# Patient Record
Sex: Female | Born: 1980 | Race: White | Hispanic: No | Smoking: Current every day smoker
Health system: Southern US, Community
[De-identification: ages and names within clinical notes are randomized; demographics above are authoritative.]

## PROBLEM LIST (undated history)

## (undated) DIAGNOSIS — K219 Gastro-esophageal reflux disease without esophagitis: Secondary | ICD-10-CM

## (undated) DIAGNOSIS — R569 Unspecified convulsions: Secondary | ICD-10-CM

## (undated) DIAGNOSIS — T8859XA Other complications of anesthesia, initial encounter: Secondary | ICD-10-CM

## (undated) HISTORY — PX: BRAIN SURGERY: SHX531

## (undated) HISTORY — PX: DIAGNOSTIC LAPAROSCOPY: SUR761

---

## 2000-02-01 ENCOUNTER — Inpatient Hospital Stay (HOSPITAL_COMMUNITY): Admission: EM | Admit: 2000-02-01 | Discharge: 2000-02-02 | Payer: Self-pay | Admitting: *Deleted

## 2001-12-10 ENCOUNTER — Encounter: Payer: Self-pay | Admitting: Emergency Medicine

## 2001-12-10 ENCOUNTER — Emergency Department (HOSPITAL_COMMUNITY): Admission: EM | Admit: 2001-12-10 | Discharge: 2001-12-10 | Payer: Self-pay | Admitting: Emergency Medicine

## 2004-04-11 ENCOUNTER — Ambulatory Visit (HOSPITAL_COMMUNITY): Admission: RE | Admit: 2004-04-11 | Discharge: 2004-04-11 | Payer: Self-pay | Admitting: Emergency Medicine

## 2005-04-29 ENCOUNTER — Emergency Department (HOSPITAL_COMMUNITY): Admission: EM | Admit: 2005-04-29 | Discharge: 2005-04-29 | Payer: Self-pay | Admitting: Emergency Medicine

## 2005-06-18 ENCOUNTER — Emergency Department (HOSPITAL_COMMUNITY): Admission: EM | Admit: 2005-06-18 | Discharge: 2005-06-18 | Payer: Self-pay | Admitting: Emergency Medicine

## 2007-02-22 ENCOUNTER — Emergency Department (HOSPITAL_COMMUNITY): Admission: EM | Admit: 2007-02-22 | Discharge: 2007-02-22 | Payer: Self-pay | Admitting: Emergency Medicine

## 2010-10-21 ENCOUNTER — Other Ambulatory Visit (HOSPITAL_COMMUNITY): Payer: Self-pay

## 2010-10-23 ENCOUNTER — Ambulatory Visit (HOSPITAL_COMMUNITY)
Admission: RE | Admit: 2010-10-23 | Discharge: 2010-10-23 | Disposition: A | Discharge status: Home / Self Care | Payer: Medicaid Other | Source: Ambulatory Visit | Attending: Oral Surgery | Admitting: Oral Surgery

## 2010-10-23 ENCOUNTER — Ambulatory Visit (HOSPITAL_COMMUNITY): Payer: Medicaid Other

## 2010-10-23 DIAGNOSIS — Z85841 Personal history of malignant neoplasm of brain: Secondary | ICD-10-CM | POA: Insufficient documentation

## 2010-10-23 DIAGNOSIS — I499 Cardiac arrhythmia, unspecified: Secondary | ICD-10-CM | POA: Insufficient documentation

## 2010-10-23 DIAGNOSIS — K029 Dental caries, unspecified: Secondary | ICD-10-CM | POA: Insufficient documentation

## 2010-10-23 DIAGNOSIS — G40909 Epilepsy, unspecified, not intractable, without status epilepticus: Secondary | ICD-10-CM | POA: Insufficient documentation

## 2010-10-23 DIAGNOSIS — IMO0002 Reserved for concepts with insufficient information to code with codable children: Secondary | ICD-10-CM

## 2010-10-23 DIAGNOSIS — Z01818 Encounter for other preprocedural examination: Secondary | ICD-10-CM | POA: Insufficient documentation

## 2010-10-23 DIAGNOSIS — I10 Essential (primary) hypertension: Secondary | ICD-10-CM | POA: Insufficient documentation

## 2010-10-23 DIAGNOSIS — Z923 Personal history of irradiation: Secondary | ICD-10-CM | POA: Insufficient documentation

## 2010-10-23 LAB — BASIC METABOLIC PANEL
CO2: 24 mEq/L (ref 19–32)
GFR calc Af Amer: >60 mL/min (ref >60)
Glucose, Bld: 91 mg/dL (ref 70–99)
Potassium: 3.8 mEq/L (ref 3.5–5.1)
Sodium: 136 mEq/L (ref 135–145)

## 2010-10-23 LAB — CBC
HCT: 38.8 % (ref 36.0–46.0)
Hemoglobin: 13.2 g/dL (ref 12.0–15.0)
MCV: 91.3 fL (ref 78.0–100.0)
RBC: 4.25 MIL/uL (ref 3.87–5.11)
WBC: 6.2 10*3/uL (ref 4.0–10.5)

## 2010-10-26 NOTE — Op Note (Signed)
NAME:  Kathryn Benson, Kathryn Benson NO.:  [id number]  MEDICAL RECORD NO.:  [id number]           PATIENT TYPE:  O  LOCATION:  [facility]                         FACILITY:  [facility]  PHYSICIAN:  Georgia Lopes, M.D.  DATE OF BIRTH:  [birth year]  DATE OF PROCEDURE:  10/23/2010 DATE OF DISCHARGE:  10/23/2010                              OPERATIVE REPORT   PREOPERATIVE DIAGNOSES: 1. Nonrestorable teeth numbers 17, 18, 19, 20, 25, 26, 27, 28, 29, 31,     and 32. 2. Irregular maxillary alveolus, upper right.  POSTOPERATIVE DIAGNOSES: 1. Nonrestorable teeth numbers 17, 18, 19, 20, 25, 26, 27, 28, 29, 31,     and 32. 2. Irregular maxillary alveolus, upper right.  PROCEDURES: 1. Extraction of teeth numbers 17, 18, 19, 20, 25, 26, 27, 28, 29, 31,     and 32. 2. Alveoplasty, upper right, lower right, and lower left.  SURGEON:  Georgia Lopes, MD  ANESTHESIA:  General, oral.  ASSISTANT:  Luberta Mutter, DOMA  INDICATIONS FOR PROCEDURE:  Kathryn Benson is a 30 year old female who was referred by her general dentist having undergone prior removal of all upper teeth by another oral surgeon as an outpatient, but the surgeon was unable to completely anesthetize the patient in order to remove all of the lower teeth as planned.  Her past medical history is significant for hypertension, seizures, and arrhythmias.  She is status post brain CA in 1986 under which she underwent surgery, radiation therapy, and chemotherapy.  Because of the difficulty with prior anesthesia and extensiveness of the procedure planned, it was recommended that the patient be intubated for airway protection to have general anesthesia.  PROCEDURE:  The patient was taken to the operating room and placed on the table in supine position.  General anesthesia was administered intravenously.  Nasal tube was attempted, but endotracheal tube was placed after the nasal intubation was unsuccessful.  The eyes were protected.  Then, the  patient was draped and the posterior pharynx was suctioned with the Yankauer suction.  A throat pack was placed.  2% lidocaine with 1:100,000 epinephrine was infiltrated and an inferior alveolar block on the right and left side and buccal infiltration around the mandible and the maxilla infiltration was performed in the upper right, a total of 16 mL was utilized of solution.  Then, a #15 blade was used to make a full-thickness incision around teeth numbers 17, 18, 19, and 20.  The periosteum was reflected.  Bone was removed with the Stryker handpiece fissure bur.  Teeth were elevated with a 301 elevator and removed from the mouth with a rongeur and the Asch forceps.  The sockets were curetted and irrigated.  The periosteum was further reflected and an alveoplasty was performed with an egg-shaped bur and bone file.  The area was irrigated and closed with 3-0 chromic.  On the right side, a #15 blade was used to make a full-thickness incision around teeth numbers 25, 26, 27, 28, 29, 30, 31, and 32 in the mandible on the buccal and lingual sides of the teeth.  Then, the periosteal elevator was used to reflect the periosteum.  Bone was removed with a Stryker handpiece and  the fissure bur and then the teeth were elevated and removed with the rongeurs and the Asch forceps.  Sockets were then curetted.  The periosteum was further reflected and then the egg-shaped bur and the bone file were used to perform the alveoplasty.  Then, the area was irrigated and closed with 3-0 chromic in the maxilla.  A #15 blade was used to make a full-thickness incision on the crest of the right maxilla from the tuberosity region to approximately tooth #7. Area in the periosteum was reflected buccally.  The egg-shaped bur and the bone file were used to remove bone in this area to perform alveoplasty, irrigation, suction, and suture with 3-0 chromic.  The oral cavity was then inspected and found to have good contour  and closure. The areas were irrigated and suctioned and then the throat pack was removed.  The patient was awakened, extubated, and taken to the recovery room breathing spontaneously in good condition.  ESTIMATED BLOOD LOSS:  Minimum.  COMPLICATIONS:  None.  SPECIMENS:  None.     Georgia Lopes, M.D.     SMJ/MEDQ  D:  10/23/2010  T:  10/24/2010  Job:  [id number]  Electronically Signed by Ocie Doyne M.D. on 10/26/2010 10:21:50 AM
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
## 2013-01-03 ENCOUNTER — Ambulatory Visit: Payer: Medicaid Other | Admitting: Neurology

## 2013-01-17 ENCOUNTER — Ambulatory Visit: Payer: Medicaid Other | Admitting: Neurology

## 2019-12-06 ENCOUNTER — Telehealth: Payer: Self-pay | Admitting: Unknown Physician Specialty

## 2019-12-06 ENCOUNTER — Other Ambulatory Visit: Payer: Self-pay | Admitting: Unknown Physician Specialty

## 2019-12-06 DIAGNOSIS — U071 COVID-19: Secondary | ICD-10-CM

## 2019-12-06 DIAGNOSIS — J42 Unspecified chronic bronchitis: Secondary | ICD-10-CM

## 2019-12-06 MED ORDER — SODIUM CHLORIDE 0.9 % IV SOLN
Freq: Once | INTRAVENOUS | Status: AC
  Filled 2019-12-06: qty 700

## 2019-12-06 NOTE — Telephone Encounter (Signed)
  I connected by phone with Kathryn Benson on 12/06/2019 at 10:02 AM to discuss the potential use of an new treatment for mild to moderate COVID-19 viral infection in non-hospitalized patients.  This patient is a 39 y.o. female that meets the FDA criteria for Emergency Use Authorization of bamlanivimab/etesevimab or casirivimab/imdevimab.  Has a (+) direct SARS-CoV-2 viral test result  Has mild or moderate COVID-19   Is NOT hospitalized due to COVID-19  Is within 10 days of symptom onset  Has at least one of the high risk factor(s) for progression to severe COVID-19 and/or hospitalization as defined in EUA.  Specific high risk criteria : COPD   I have spoken and communicated the following to the patient or parent/caregiver:  1. FDA has authorized the emergency use of bamlanivimab/etesevimab and casirivimab\imdevimab for the treatment of mild to moderate COVID-19 in adults and pediatric patients with positive results of direct SARS-CoV-2 viral testing who are 73 years of age and older weighing at least 40 kg, and who are at high risk for progressing to severe COVID-19 and/or hospitalization.  2. The significant known and potential risks and benefits of bamlanivimab/etesevimab and casirivimab\imdevimab, and the extent to which such potential risks and benefits are unknown.  3. Information on available alternative treatments and the risks and benefits of those alternatives, including clinical trials.  4. Patients treated with bamlanivimab/etesevimab and casirivimab\imdevimab should continue to self-isolate and use infection control measures (e.g., wear mask, isolate, social distance, avoid sharing personal items, clean and disinfect "high touch" surfaces, and frequent handwashing) according to CDC guidelines.   5. The patient or parent/caregiver has the option to accept or refuse bamlanivimab/etesevimab or casirivimab\imdevimab .  After reviewing this information with the patient, The  patient agreed to proceed with receiving the bamlanimivab infusion and will be provided a copy of the Fact sheet prior to receiving the infusion.Gabriel Cirri 12/06/2019 10:02 AM Sx onset 5/31

## 2019-12-07 ENCOUNTER — Encounter (HOSPITAL_COMMUNITY): Payer: Self-pay

## 2019-12-07 ENCOUNTER — Ambulatory Visit (HOSPITAL_COMMUNITY)
Admission: RE | Admit: 2019-12-07 | Discharge: 2019-12-07 | Disposition: A | Discharge status: Home / Self Care | Payer: Medicaid Other | Source: Ambulatory Visit | Attending: Pulmonary Disease | Admitting: Pulmonary Disease

## 2019-12-07 DIAGNOSIS — J42 Unspecified chronic bronchitis: Secondary | ICD-10-CM | POA: Diagnosis present

## 2019-12-07 DIAGNOSIS — U071 COVID-19: Secondary | ICD-10-CM | POA: Diagnosis present

## 2019-12-07 MED ORDER — METHYLPREDNISOLONE SODIUM SUCC 125 MG IJ SOLR: 125.0000 mg | Freq: Once | INTRAMUSCULAR | Status: DC | PRN

## 2019-12-07 MED ORDER — EPINEPHRINE 0.3 MG/0.3ML IJ SOAJ: 0.3000 mg | Freq: Once | INTRAMUSCULAR | Status: DC | PRN

## 2019-12-07 MED ORDER — ALBUTEROL SULFATE HFA 108 (90 BASE) MCG/ACT IN AERS: 2.0000 | INHALATION_SPRAY | Freq: Once | RESPIRATORY_TRACT | Status: DC | PRN

## 2019-12-07 MED ORDER — SODIUM CHLORIDE 0.9 % IV SOLN: INTRAVENOUS | Status: DC | PRN

## 2019-12-07 MED ORDER — DIPHENHYDRAMINE HCL 50 MG/ML IJ SOLN: 50.0000 mg | Freq: Once | INTRAMUSCULAR | Status: DC | PRN

## 2019-12-07 MED ORDER — FAMOTIDINE IN NACL 20-0.9 MG/50ML-% IV SOLN: 20.0000 mg | Freq: Once | INTRAVENOUS | Status: DC | PRN

## 2019-12-07 NOTE — Discharge Instructions (Signed)

## 2019-12-07 NOTE — Progress Notes (Signed)
  Diagnosis: COVID-19  Physician: Dr Delford Field  Procedure: [facility] Med: bamlanivimab\etesevimab infusion - Provided patient with bamlanimivab\etesevimab fact sheet for patients, parents and caregivers prior to infusion.  Complications: No immediate complications noted.  Discharge: Discharged home   Kathryn Benson 12/07/2019

## 2019-12-11 NOTE — Telephone Encounter (Signed)
Reason for Disposition  ??? [1] HIGH RISK patient (e.g., age > 64 years, diabetes, heart or lung disease, weak immune system) AND [2] new or worsening symptoms    Answer Assessment - Initial Assessment Questions  1. COVID-19 DIAGNOSIS: "Who made your Coronavirus (COVID-19) diagnosis?" "Was it confirmed by a positive lab test?" If not diagnosed by a HCP, ask "Are there lots of cases (community spread) where you live?" (See public health department website, if unsure)  Tested positive    2. COVID-19 EXPOSURE: "Was there any known exposure to COVID before the symptoms began?" CDC Definition of close contact: within 6 feet (2 meters) for a total of 15 minutes or more over a 24-hour period.   Yes    3. ONSET: "When did the COVID-19 symptoms start?"   12/03/2019    4. WORST SYMPTOM: "What is your worst symptom?" (e.g., cough, fever, shortness of breath, muscle aches)  Headache and skin burning, received plasma infusion for covid treatment yesterday.    5. COUGH: "Do you have a cough?" If so, ask: "How bad is the cough?"    Yes, severe    6. FEVER: "Do you have a fever?" If so, ask: "What is your temperature, how was it measured, and when did it start?"     Yes, 103.2, oral    7. RESPIRATORY STATUS: "Describe your breathing?" (e.g., shortness of breath, wheezing, unable to speak)     Shortness of breath, wheezing    8. BETTER-SAME-WORSE: "Are you getting better, staying the same or getting worse compared to yesterday?"  If getting worse, ask, "In what way?"  Worse, skin feels like it's burning and headache is worse    9. HIGH RISK DISEASE: "Do you have any chronic medical problems?" (e.g., asthma, heart or lung disease, weak immune system, obesity, etc.)  Epilepsy, COPD, immnosuppressed    10. PREGNANCY: "Is there any chance you are pregnant?" "When was your last menstrual period?"    No chance of pregnancy    11. OTHER SYMPTOMS: "Do you have any other symptoms?"  (e.g., chills, fatigue, headache, loss of smell or taste,  muscle pain, sore throat; new loss of smell or taste especially support the diagnosis of COVID-19)  Chills, fatigue, headache, loss of taste and smell, muscle pain, joint pain, loss of taste and smell, shortness of breath, cough, sore throat    Protocols used: CORONAVIRUS (COVID-19) DIAGNOSED OR SUSPECTED-ADULT-AH    Caller reports symptoms as documented above and discussed precautions/social distancing/hand hygiene.  Care advice as documented, however caller decided to go to ED, as provider told her yesterday if she feels worse to go to ED.

## 2020-10-25 ENCOUNTER — Emergency Department (HOSPITAL_COMMUNITY): Payer: Medicaid Other

## 2020-10-25 ENCOUNTER — Encounter (HOSPITAL_COMMUNITY): Payer: Self-pay | Admitting: *Deleted

## 2020-10-25 ENCOUNTER — Other Ambulatory Visit: Payer: Self-pay

## 2020-10-25 ENCOUNTER — Observation Stay (HOSPITAL_COMMUNITY)
Admission: EM | Admit: 2020-10-25 | Discharge: 2020-10-26 | Disposition: A | Discharge status: Home / Self Care | Payer: Medicaid Other | Attending: Surgery | Admitting: Surgery

## 2020-10-25 DIAGNOSIS — K810 Acute cholecystitis: Secondary | ICD-10-CM | POA: Diagnosis not present

## 2020-10-25 DIAGNOSIS — F17200 Nicotine dependence, unspecified, uncomplicated: Secondary | ICD-10-CM | POA: Diagnosis not present

## 2020-10-25 DIAGNOSIS — R1011 Right upper quadrant pain: Secondary | ICD-10-CM | POA: Diagnosis present

## 2020-10-25 DIAGNOSIS — K819 Cholecystitis, unspecified: Secondary | ICD-10-CM

## 2020-10-25 DIAGNOSIS — Z20822 Contact with and (suspected) exposure to covid-19: Secondary | ICD-10-CM | POA: Diagnosis not present

## 2020-10-25 HISTORY — DX: Gastro-esophageal reflux disease without esophagitis: K21.9

## 2020-10-25 HISTORY — DX: Unspecified convulsions: R56.9

## 2020-10-25 HISTORY — DX: Other complications of anesthesia, initial encounter: T88.59XA

## 2020-10-25 LAB — CBC WITH DIFFERENTIAL/PLATELET
Abs Immature Granulocytes: 0.05 10*3/uL (ref 0.00–0.07)
Basophils Absolute: 0.1 10*3/uL (ref 0.0–0.1)
Basophils Relative: 1 %
Eosinophils Absolute: 0.2 10*3/uL (ref 0.0–0.5)
Eosinophils Relative: 2 %
HCT: 34.7 % — ABNORMAL LOW (ref 36.0–46.0)
Hemoglobin: 11.5 g/dL — ABNORMAL LOW (ref 12.0–15.0)
Immature Granulocytes: 0 %
Lymphocytes Relative: 39 %
Lymphs Abs: 4.4 10*3/uL — ABNORMAL HIGH (ref 0.7–4.0)
MCH: 32.6 pg (ref 26.0–34.0)
MCHC: 33.1 g/dL (ref 30.0–36.0)
MCV: 98.3 fL (ref 80.0–100.0)
Monocytes Absolute: 0.6 10*3/uL (ref 0.1–1.0)
Monocytes Relative: 6 %
Neutro Abs: 6 10*3/uL (ref 1.7–7.7)
Neutrophils Relative %: 52 %
Platelets: 394 10*3/uL (ref 150–400)
RBC: 3.53 MIL/uL — ABNORMAL LOW (ref 3.87–5.11)
RDW: 13.5 % (ref 11.5–15.5)
WBC: 11.3 10*3/uL — ABNORMAL HIGH (ref 4.0–10.5)
nRBC: 0 % (ref 0.0–0.2)

## 2020-10-25 LAB — COMPREHENSIVE METABOLIC PANEL
ALT: 14 U/L (ref 0–44)
AST: 11 U/L — ABNORMAL LOW (ref 15–41)
Albumin: 3.3 g/dL — ABNORMAL LOW (ref 3.5–5.0)
Alkaline Phosphatase: 77 U/L (ref 38–126)
Anion gap: 6 (ref 5–15)
BUN: 5 mg/dL — ABNORMAL LOW (ref 6–20)
CO2: 24 mmol/L (ref 22–32)
Calcium: 8.4 mg/dL — ABNORMAL LOW (ref 8.9–10.3)
Chloride: 106 mmol/L (ref 98–111)
Creatinine, Ser: 0.68 mg/dL (ref 0.44–1.00)
GFR, Estimated: >60 mL/min (ref >60)
Glucose, Bld: 96 mg/dL (ref 70–99)
Potassium: 3.4 mmol/L — ABNORMAL LOW (ref 3.5–5.1)
Sodium: 136 mmol/L (ref 135–145)
Total Bilirubin: 0.5 mg/dL (ref 0.3–1.2)
Total Protein: 6.6 g/dL (ref 6.5–8.1)

## 2020-10-25 LAB — PROTIME-INR
INR: 0.9 (ref 0.8–1.2)
Prothrombin Time: 12.4 seconds (ref 11.4–15.2)

## 2020-10-25 LAB — URINALYSIS, ROUTINE W REFLEX MICROSCOPIC
Bilirubin Urine: NEGATIVE
Glucose, UA: NEGATIVE mg/dL
Hgb urine dipstick: NEGATIVE
Ketones, ur: NEGATIVE mg/dL
Leukocytes,Ua: NEGATIVE
Nitrite: NEGATIVE
Protein, ur: NEGATIVE mg/dL
Specific Gravity, Urine: 1.004 — ABNORMAL LOW (ref 1.005–1.030)
pH: 7 (ref 5.0–8.0)

## 2020-10-25 LAB — I-STAT BETA HCG BLOOD, ED (MC, WL, AP ONLY): I-stat hCG, quantitative: <5 m[IU]/mL (ref <5)

## 2020-10-25 LAB — LACTIC ACID, PLASMA: Lactic Acid, Venous: 1.1 mmol/L (ref 0.5–1.9)

## 2020-10-25 MED ORDER — ONDANSETRON HCL 4 MG/2ML IJ SOLN
4.0000 mg | Freq: Once | INTRAMUSCULAR | Status: DC
  Filled 2020-10-25: qty 2

## 2020-10-25 MED ORDER — MORPHINE SULFATE (PF) 4 MG/ML IV SOLN
4.0000 mg | Freq: Once | INTRAVENOUS | Status: AC
Start: 2020-10-26 — End: 2020-10-26
  Administered 2020-10-26: 4 mg via INTRAVENOUS
  Filled 2020-10-25: qty 1

## 2020-10-25 NOTE — ED Triage Notes (Signed)
The pt has abd pain for 2-3 weeks  She has known gallbladder  Problems  The pain is worse today  elavted temp for 3 days    lmp last week

## 2020-10-26 ENCOUNTER — Encounter (HOSPITAL_COMMUNITY): Payer: Self-pay | Admitting: General Surgery

## 2020-10-26 ENCOUNTER — Other Ambulatory Visit: Payer: Self-pay

## 2020-10-26 ENCOUNTER — Encounter (HOSPITAL_COMMUNITY)
Admission: EM | Disposition: A | Discharge status: Home / Self Care | Payer: Self-pay | Source: Home / Self Care | Attending: Emergency Medicine

## 2020-10-26 ENCOUNTER — Observation Stay (HOSPITAL_COMMUNITY): Payer: Medicaid Other | Admitting: Certified Registered Nurse Anesthetist

## 2020-10-26 ENCOUNTER — Emergency Department (HOSPITAL_COMMUNITY): Payer: Medicaid Other

## 2020-10-26 DIAGNOSIS — K810 Acute cholecystitis: Secondary | ICD-10-CM | POA: Diagnosis present

## 2020-10-26 LAB — SURGICAL PCR SCREEN
MRSA, PCR: NEGATIVE
Staphylococcus aureus: POSITIVE — AB

## 2020-10-26 LAB — RESP PANEL BY RT-PCR (FLU A&B, COVID) ARPGX2
Influenza A by PCR: NEGATIVE
Influenza B by PCR: NEGATIVE
SARS Coronavirus 2 by RT PCR: NEGATIVE

## 2020-10-26 SURGERY — Surgical Case
Anesthesia: General

## 2020-10-26 MED ORDER — LACTATED RINGERS IV SOLN: INTRAVENOUS | Status: DC

## 2020-10-26 MED ORDER — PROPOFOL 10 MG/ML IV BOLUS
INTRAVENOUS | Status: AC
  Filled 2020-10-26: qty 20

## 2020-10-26 MED ORDER — SCOPOLAMINE 1 MG/3DAYS TD PT72: 1.0000 | MEDICATED_PATCH | TRANSDERMAL | Status: DC

## 2020-10-26 MED ORDER — KCL IN DEXTROSE-NACL 10-5-0.45 MEQ/L-%-% IV SOLN
INTRAVENOUS | Status: DC
  Filled 2020-10-26: qty 1000

## 2020-10-26 MED ORDER — FENTANYL CITRATE (PF) 100 MCG/2ML IJ SOLN
50.0000 ug | INTRAMUSCULAR | Status: DC | PRN
  Administered 2020-10-26: 50 ug via INTRAVENOUS
  Filled 2020-10-26: qty 2

## 2020-10-26 MED ORDER — BUPIVACAINE-EPINEPHRINE (PF) 0.25% -1:200000 IJ SOLN
INTRAMUSCULAR | Status: AC
  Filled 2020-10-26: qty 30

## 2020-10-26 MED ORDER — MIDAZOLAM HCL 2 MG/2ML IJ SOLN
INTRAMUSCULAR | Status: AC
  Filled 2020-10-26: qty 2

## 2020-10-26 MED ORDER — PROCHLORPERAZINE EDISYLATE 10 MG/2ML IJ SOLN
5.0000 mg | Freq: Four times a day (QID) | INTRAMUSCULAR | Status: DC | PRN
  Administered 2020-10-26: 10 mg via INTRAVENOUS
  Filled 2020-10-26: qty 2

## 2020-10-26 MED ORDER — CIPROFLOXACIN HCL 500 MG PO TABS: 500.0000 mg | ORAL_TABLET | Freq: Two times a day (BID) | ORAL | 0 refills | Status: AC

## 2020-10-26 MED ORDER — MORPHINE SULFATE (PF) 4 MG/ML IV SOLN
4.0000 mg | Freq: Once | INTRAVENOUS | Status: AC
Start: 2020-10-26 — End: 2020-10-26
  Administered 2020-10-26: 4 mg via INTRAMUSCULAR
  Filled 2020-10-26: qty 1

## 2020-10-26 MED ORDER — NICOTINE 14 MG/24HR TD PT24: 14.0000 mg | MEDICATED_PATCH | Freq: Every day | TRANSDERMAL | Status: DC

## 2020-10-26 MED ORDER — PROMETHAZINE HCL 25 MG PO TABS
25.0000 mg | ORAL_TABLET | ORAL | Status: AC
  Administered 2020-10-26: 25 mg via ORAL
  Filled 2020-10-26: qty 1

## 2020-10-26 MED ORDER — ACETAMINOPHEN 500 MG PO TABS
1000.0000 mg | ORAL_TABLET | Freq: Four times a day (QID) | ORAL | Status: DC
  Filled 2020-10-26: qty 2

## 2020-10-26 MED ORDER — CIPROFLOXACIN IN D5W 400 MG/200ML IV SOLN
400.0000 mg | Freq: Two times a day (BID) | INTRAVENOUS | Status: DC
  Administered 2020-10-26: 400 mg via INTRAVENOUS
  Filled 2020-10-26 (×2): qty 200

## 2020-10-26 MED ORDER — PROCHLORPERAZINE MALEATE 10 MG PO TABS
10.0000 mg | ORAL_TABLET | Freq: Four times a day (QID) | ORAL | Status: DC | PRN
  Filled 2020-10-26: qty 1

## 2020-10-26 MED ORDER — CHLORHEXIDINE GLUCONATE 0.12 % MT SOLN
OROMUCOSAL | Status: AC
  Administered 2020-10-26: 15 mL via OROMUCOSAL
  Filled 2020-10-26: qty 15

## 2020-10-26 MED ORDER — SCOPOLAMINE 1 MG/3DAYS TD PT72
MEDICATED_PATCH | TRANSDERMAL | Status: AC
  Administered 2020-10-26: 1.5 mg via TRANSDERMAL
  Filled 2020-10-26: qty 1

## 2020-10-26 MED ORDER — FENTANYL CITRATE (PF) 250 MCG/5ML IJ SOLN
INTRAMUSCULAR | Status: AC
  Filled 2020-10-26: qty 5

## 2020-10-26 MED ORDER — CHLORHEXIDINE GLUCONATE 0.12 % MT SOLN: 15.0000 mL | Freq: Once | OROMUCOSAL | Status: AC

## 2020-10-26 NOTE — Progress Notes (Addendum)
Subjective No acute events. States she feels better and that her RUQ pain has ~resolved. She states to me she does not want to have surgery right now and understands that if she has an infection in her gallbladder that we suspect she has, her symptoms may recur or even result in perforation of the gallbladder and what this could entail including drain placement etc. She states she understands everything ok.  Objective: Vital signs in last 24 hours: Temp:  [98.2 F (36.8 C)-98.5 F (36.9 C)] 98.2 F (36.8 C) (04/24 0347) Pulse Rate:  [59-74] 59 (04/24 0347) Resp:  [16-21] 18 (04/24 0347) BP: (100-112)/(57-77) 102/57 (04/24 0347) SpO2:  [93 %-98 %] 98 % (04/24 0347) Weight:  [81.6 kg] 81.6 kg (04/23 2140) Last BM Date: 10/25/20  Intake/Output from previous day: 04/23 0701 - 04/24 0700 In: 122 [I.V.:114.7; IV Piggyback:7.3] Out: -  Intake/Output this shift: No intake/output data recorded.  Gen: NAD, comfortable CV: RRR Pulm: Normal work of breathing Abd: Soft, NT/ND. Ext: SCDs in place  Lab Results: CBC  Recent Labs    10/25/20 2200  WBC 11.3*  HGB 11.5*  HCT 34.7*  PLT 394   BMET Recent Labs    10/25/20 2200  NA 136  K 3.4*  CL 106  CO2 24  GLUCOSE 96  BUN 5*  CREATININE 0.68  CALCIUM 8.4*   PT/INR Recent Labs    10/25/20 2200  LABPROT 12.4  INR 0.9   ABG No results for input(s): PHART, HCO3 in the last 72 hours.  Invalid input(s): PCO2, PO2  Studies/Results:  Anti-infectives: Anti-infectives (From admission, onward)   Start     Dose/Rate Route Frequency Ordered Stop   10/26/20 0245  [MAR Hold]  ciprofloxacin (CIPRO) IVPB 400 mg        (MAR Hold since Sun 10/26/2020 at 0641.Hold Reason: Transfer to a Procedural area.)   400 mg 200 mL/hr over 60 Minutes Intravenous 2 times daily 10/26/20 0227         Assessment/Plan: Patient Active Problem List   Diagnosis Date Noted  . Acute cholecystitis 10/26/2020    -She is of sound mind and states she  understands everything quite well. We worked to address her concerns about having surgery and she remains adamant that she will not have surgery at this time. She would like to leave the hospital against medical advice. We will remain available to assist in her care. She states she has appointment in [facility] on Thursday to meet with another surgeon and has declined follow-up in our office   LOS: 0 days   Marin Olp, MD [facility] Use [email or web address] to contact on call provider

## 2020-10-26 NOTE — ED Provider Notes (Signed)
[facility] EMERGENCY DEPARTMENT Provider Note   CSN: [id number] Arrival date & time: 10/25/20  2033     History Chief Complaint  Patient presents with  . Abdominal Pain    Kathryn Benson is a 40 y.o. female.  Patient presents to the emergency department with a chief complaint of right upper abdominal pain.  She reports known gallstones.  States that she has follow-up with a Careers adviser in [place].  She reports intermittent fevers and now persistent vomiting today.  She rates the pain as a 9 out of 10.  It is worsened with palpation.  She denies any successful treatments prior to arrival.  The history is provided by the patient. No language interpreter was used.       Past Medical History:  Diagnosis Date  . Seizures (HCC)     There are no problems to display for this patient.   History reviewed. No pertinent surgical history.   OB History   No obstetric history on file.     No family history on file.  Social History   Tobacco Use  . Smoking status: Current Every Day Smoker  . Smokeless tobacco: Never Used  Substance Use Topics  . Alcohol use: Never    Home Medications Prior to Admission medications   Not on File    Allergies    Dilaudid [hydromorphone], Rocephin [ceftriaxone], Toradol [ketorolac tromethamine], Zofran [ondansetron], Barium-containing compounds, Biaxin [clarithromycin], Imitrex [sumatriptan], Meclizine, and Nubain [nalbuphine]  Review of Systems   Review of Systems  All other systems reviewed and are negative.   Physical Exam Updated Vital Signs BP 107/69 (BP Location: Left Arm)   Pulse 74   Temp 98.3 F (36.8 C) (Oral)   Resp 16   Ht 5\' 4"  (1.626 m)   Wt 81.6 kg   LMP 10/10/2020   SpO2 97%   BMI 30.90 kg/m   Physical Exam Vitals and nursing note reviewed.  Constitutional:      General: She is not in acute distress.    Appearance: She is well-developed.  HENT:     Head: Normocephalic and atraumatic.   Eyes:     Conjunctiva/sclera: Conjunctivae normal.  Cardiovascular:     Rate and Rhythm: Normal rate and regular rhythm.     Heart sounds: No murmur heard.   Pulmonary:     Effort: Pulmonary effort is normal. No respiratory distress.     Breath sounds: Normal breath sounds.  Abdominal:     Palpations: Abdomen is soft.     Tenderness: There is abdominal tenderness.     Comments: RUQ TTP  Musculoskeletal:     Cervical back: Neck supple.  Skin:    General: Skin is warm and dry.  Neurological:     Mental Status: She is alert and oriented to person, place, and time.  Psychiatric:        Mood and Affect: Mood normal.        Behavior: Behavior normal.     ED Results / Procedures / Treatments   Labs (all labs ordered are listed, but only abnormal results are displayed) Labs Reviewed  COMPREHENSIVE METABOLIC PANEL - Abnormal; Notable for the following components:      Result Value   Potassium 3.4 (*)    BUN 5 (*)    Calcium 8.4 (*)    Albumin 3.3 (*)    AST 11 (*)    All other components within normal limits  CBC WITH DIFFERENTIAL/PLATELET - Abnormal; Notable for  the following components:   WBC 11.3 (*)    RBC 3.53 (*)    Hemoglobin 11.5 (*)    HCT 34.7 (*)    Lymphs Abs 4.4 (*)    All other components within normal limits  URINALYSIS, ROUTINE W REFLEX MICROSCOPIC - Abnormal; Notable for the following components:   Color, Urine STRAW (*)    Specific Gravity, Urine 1.004 (*)    Bacteria, UA RARE (*)    All other components within normal limits  CULTURE, BLOOD (ROUTINE X 2)  CULTURE, BLOOD (ROUTINE X 2)  LACTIC ACID, PLASMA  PROTIME-INR  LACTIC ACID, PLASMA  I-STAT BETA HCG BLOOD, ED ([facility], [facility], [facility] ONLY)    EKG None  Radiology DG Chest 2 View  Result Date: 10/25/2020 CLINICAL DATA:  Suspected sepsis EXAM: CHEST - 2 VIEW COMPARISON:  October 10, 2019 FINDINGS: The heart size and mediastinal contours are within normal limits. Both lungs are clear. The visualized  skeletal structures are unremarkable. IMPRESSION: No active cardiopulmonary disease. Electronically Signed   By: Maudry Mayhew MD   On: 10/25/2020 22:18    Procedures Procedures   Medications Ordered in ED Medications  morphine 4 MG/ML injection 4 mg (has no administration in time range)  ondansetron (ZOFRAN) injection 4 mg (has no administration in time range)    ED Course  I have reviewed the triage vital signs and the nursing notes.  Pertinent labs & imaging results that were available during my care of the patient were reviewed by me and considered in my medical decision making (see chart for details).    MDM Rules/Calculators/A&P                          This patient complains of RUQ abdominal pain, this involves an extensive number of treatment options, and is a complaint that carries with it a high risk of complications and morbidity.    Differential Dx Cholelithiasis, cholecystitis, appy, gastro  Pertinent Labs I ordered, reviewed, and interpreted labs, which included mild leukocytosis, mild hypokalemia of 3.4, HCG negative, lactic 1.1.  Imaging Interpretation I ordered imaging studies which included RUQ [place], which showed cholelithiasis with positive sonographic Murphy sign.   Medications I ordered medication morphine for pain.  Reassessments After the interventions stated above, I reevaluated the patient and found still having significant RUQ pain.  Consultants Case discussed with Dr. Sheliah Hatch, who will come to evaluate the patient.  Plan Appreciate Dr. Sheliah Hatch for admitting.     Final Clinical Impression(s) / ED Diagnoses Final diagnoses:  Cholecystitis    Rx / DC Orders ED Discharge Orders    None       Roxy Horseman, PA-C 10/26/20 0233    Nira Conn, MD 10/26/20 [phone]

## 2020-10-26 NOTE — ED Notes (Signed)
RN attempted to call report x1 

## 2020-10-26 NOTE — Progress Notes (Signed)
Pt to OR.

## 2020-10-26 NOTE — H&P (Signed)
Subjective: Patient is a 40 y.o. female with a PMH notable for epilepsy and Crohn's disease for which she is currently not on any therapy.  She presents to the ED today with a 1 day history of right upper quadrant abdominal pain that started earlier today and has been waxing and waning throughout the day.  She describes the pain as colicky. The pain is radiating to her back.  She also reports nausea and vomiting as well as diarrhea.  She has had similar symptoms for the last 2 weeks that required 2 presentations to a different ED.  At that time she was already once diagnosed with acute cholecystitis but had to leave the ED AMA because just two little kids at home.  During her second presentation her symptoms again seemed classic for acute cholecystitis however her ultrasound finding or not as conclusive.  Therefore she was advised to see a surgeon outpatient.   Patient Active Problem List   Diagnosis Date Noted  . Acute cholecystitis 10/26/2020   Past Medical History:  Diagnosis Date  . Seizures (HCC)       Current Facility-Administered Medications  Medication Dose Route Frequency Provider Last Rate Last Admin  . acetaminophen (TYLENOL) tablet 1,000 mg  1,000 mg Oral Q6H Isabel Caprice, MD      . ciprofloxacin (CIPRO) IVPB 400 mg  400 mg Intravenous Q12H Isabel Caprice, MD      . dextrose 5 % and 0.45 % NaCl with KCl 10 mEq/L infusion   Intravenous Continuous Isabel Caprice, MD      . fentaNYL (SUBLIMAZE) injection 50 mcg  50 mcg Intravenous Q2H PRN Isabel Caprice, MD      . nicotine (NICODERM CQ - dosed in mg/24 hours) patch 14 mg  14 mg Transdermal Daily Isabel Caprice, MD      . prochlorperazine (COMPAZINE) tablet 10 mg  10 mg Oral Q6H PRN Isabel Caprice, MD       Or  . prochlorperazine (COMPAZINE) injection 5-10 mg  5-10 mg Intravenous Q6H PRN Isabel Caprice, MD       No current outpatient medications on file.   Allergies  Allergen Reactions  . Dilaudid [Hydromorphone] Anaphylaxis   . Rocephin [Ceftriaxone] Shortness Of Breath and Nausea And Vomiting  . Toradol [Ketorolac Tromethamine] Shortness Of Breath  . Zofran [Ondansetron] Anaphylaxis  . Barium-Containing Compounds Rash and Other (See Comments)    Mouth blisters   . Biaxin [Clarithromycin] Itching and Rash  . Imitrex [Sumatriptan] Itching and Rash  . Meclizine Itching and Rash  . Nubain [Nalbuphine] Itching and Rash    Social History   Tobacco Use  . Smoking status: Current Every Day Smoker  . Smokeless tobacco: Never Used  Substance Use Topics  . Alcohol use: Never    No family history on file.  Review of Systems Pertinent items are noted in HPI.  Objective: Vital signs in last 24 hours: Temp:  [98.3 F (36.8 C)] 98.3 F (36.8 C) (04/23 2051) Pulse Rate:  [64-74] 64 (04/24 0124) Resp:  [16-21] 16 (04/24 0124) BP: (100-112)/(64-77) 100/77 (04/24 0124) SpO2:  [97 %-98 %] 97 % (04/24 0124) Weight:  [81.6 kg] 81.6 kg (04/23 2140)  General appearance: alert and oriented, severe distress due to pain Lungs: clear breath sounds, normal wob Heart: RRR Abdomen: soft, nondistended, tenderness in RUQ with positive murphy sign Extremities: no lower extremity edema  Data Review:  CBC:  Lab Results  Component Value Date   WBC 11.3 (H) 10/25/2020  RBC 3.53 (L) 10/25/2020    CMP Latest Ref Rng & Units 10/25/2020 10/23/2010  Glucose 70 - 99 mg/dL 96 91  BUN 6 - 20 mg/dL 5(L) 5(L)  Creatinine 4.56 - 1.00 mg/dL 2.56 3.89  Sodium 373 - 145 mmol/L 136 136  Potassium 3.5 - 5.1 mmol/L 3.4(L) 3.8  Chloride 98 - 111 mmol/L 106 107  CO2 22 - 32 mmol/L 24 24  Calcium 8.9 - 10.3 mg/dL 4.2(A) 8.7  Total Protein 6.5 - 8.1 g/dL 6.6 -  Total Bilirubin 0.3 - 1.2 mg/dL 0.5 -  Alkaline Phos 38 - 126 U/L 77 -  AST 15 - 41 U/L 11(L) -  ALT 0 - 44 U/L 14 -     Assessment/Plan: Patient is a 40 y.o. female with a PMH notable for epilepsy and Crohn's disease for which she is currently not on any therapy.  Her  past surgical history is notable for laparoscopic ovarian cyst removal.  She presents to the ED today with acute right upper quadrant abdominal pain that is radiating to her back and associated with nausea and vomiting.  On exam she has a positive Murphy sign.  Further work-up revealed white blood count of 11.3 and a ultrasound of her gallbladder revealed multiple gallstones.  She has been dealing with similar symptoms for the last 2 weeks and at least 1 ultrasound revealed clear signs of acute cholecystitis.  At that time she was not able to receive for immediate surgical management and had to leave the ED AMA for her 2 little kids at home.  Overall, given the persistence of her symptoms over the last few weeks and severity of her pain today I believe it would be best to proceed with laparoscopic cholecystectomy in the morning.   Plan: - NPO - pain meds PRN with tylenol and fentanyl - IV Cipro - admission to general surgery for laparoscopic cholecystectomy in am  Isabel Caprice, MD

## 2020-10-26 NOTE — Anesthesia Preprocedure Evaluation (Addendum)
Anesthesia Evaluation  Patient identified by MRN, date of birth, ID band Patient awake    Reviewed: Allergy & Precautions, NPO status , Patient's Chart, lab work & pertinent test results  History of Anesthesia Complications (+) PONV and history of anesthetic complications  Airway Mallampati: II  TM Distance: >3 FB Neck ROM: Full    Dental  (+) Dental Advisory Given, Edentulous Upper, Edentulous Lower   Pulmonary Current Smoker and Patient abstained from smoking.,    Pulmonary exam normal breath sounds clear to auscultation       Cardiovascular negative cardio ROS Normal cardiovascular exam Rhythm:Regular Rate:Normal     Neuro/Psych Seizures - (last seizure 1 year ago), Well Controlled,  negative psych ROS   GI/Hepatic GERD  Medicated,Cholecystitis  Crohns disease   Endo/Other  Obesity   Renal/GU negative Renal ROS     Musculoskeletal negative musculoskeletal ROS (+)   Abdominal   Peds  Hematology  (+) Blood dyscrasia, anemia ,   Anesthesia Other Findings Day of surgery medications reviewed with the patient.  Reproductive/Obstetrics                           Anesthesia Physical Anesthesia Plan  ASA: II  Anesthesia Plan: General   Post-op Pain Management:    Induction: Intravenous  PONV Risk Score and Plan: 3 and Midazolam, Dexamethasone, Diphenhydramine, TIVA and Scopolamine patch - Pre-op  Airway Management Planned: Oral ETT  Additional Equipment:   Intra-op Plan:   Post-operative Plan: Extubation in OR  Informed Consent: I have reviewed the patients History and Physical, chart, labs and discussed the procedure including the risks, benefits and alternatives for the proposed anesthesia with the patient or authorized representative who has indicated his/her understanding and acceptance.     Dental advisory given  Plan Discussed with: CRNA  Anesthesia Plan Comments:         Anesthesia Quick Evaluation

## 2020-10-30 LAB — CULTURE, BLOOD (ROUTINE X 2)
Culture: NO GROWTH
Culture: NO GROWTH
Special Requests: ADEQUATE

## 2021-07-07 IMAGING — CR DG CHEST 2V
2 series · 2 of 2 positions shown · non-contrast
Comparison: October 10, 2019

CLINICAL DATA: Suspected sepsis

EXAM:
CHEST - 2 VIEW

[chest lat]
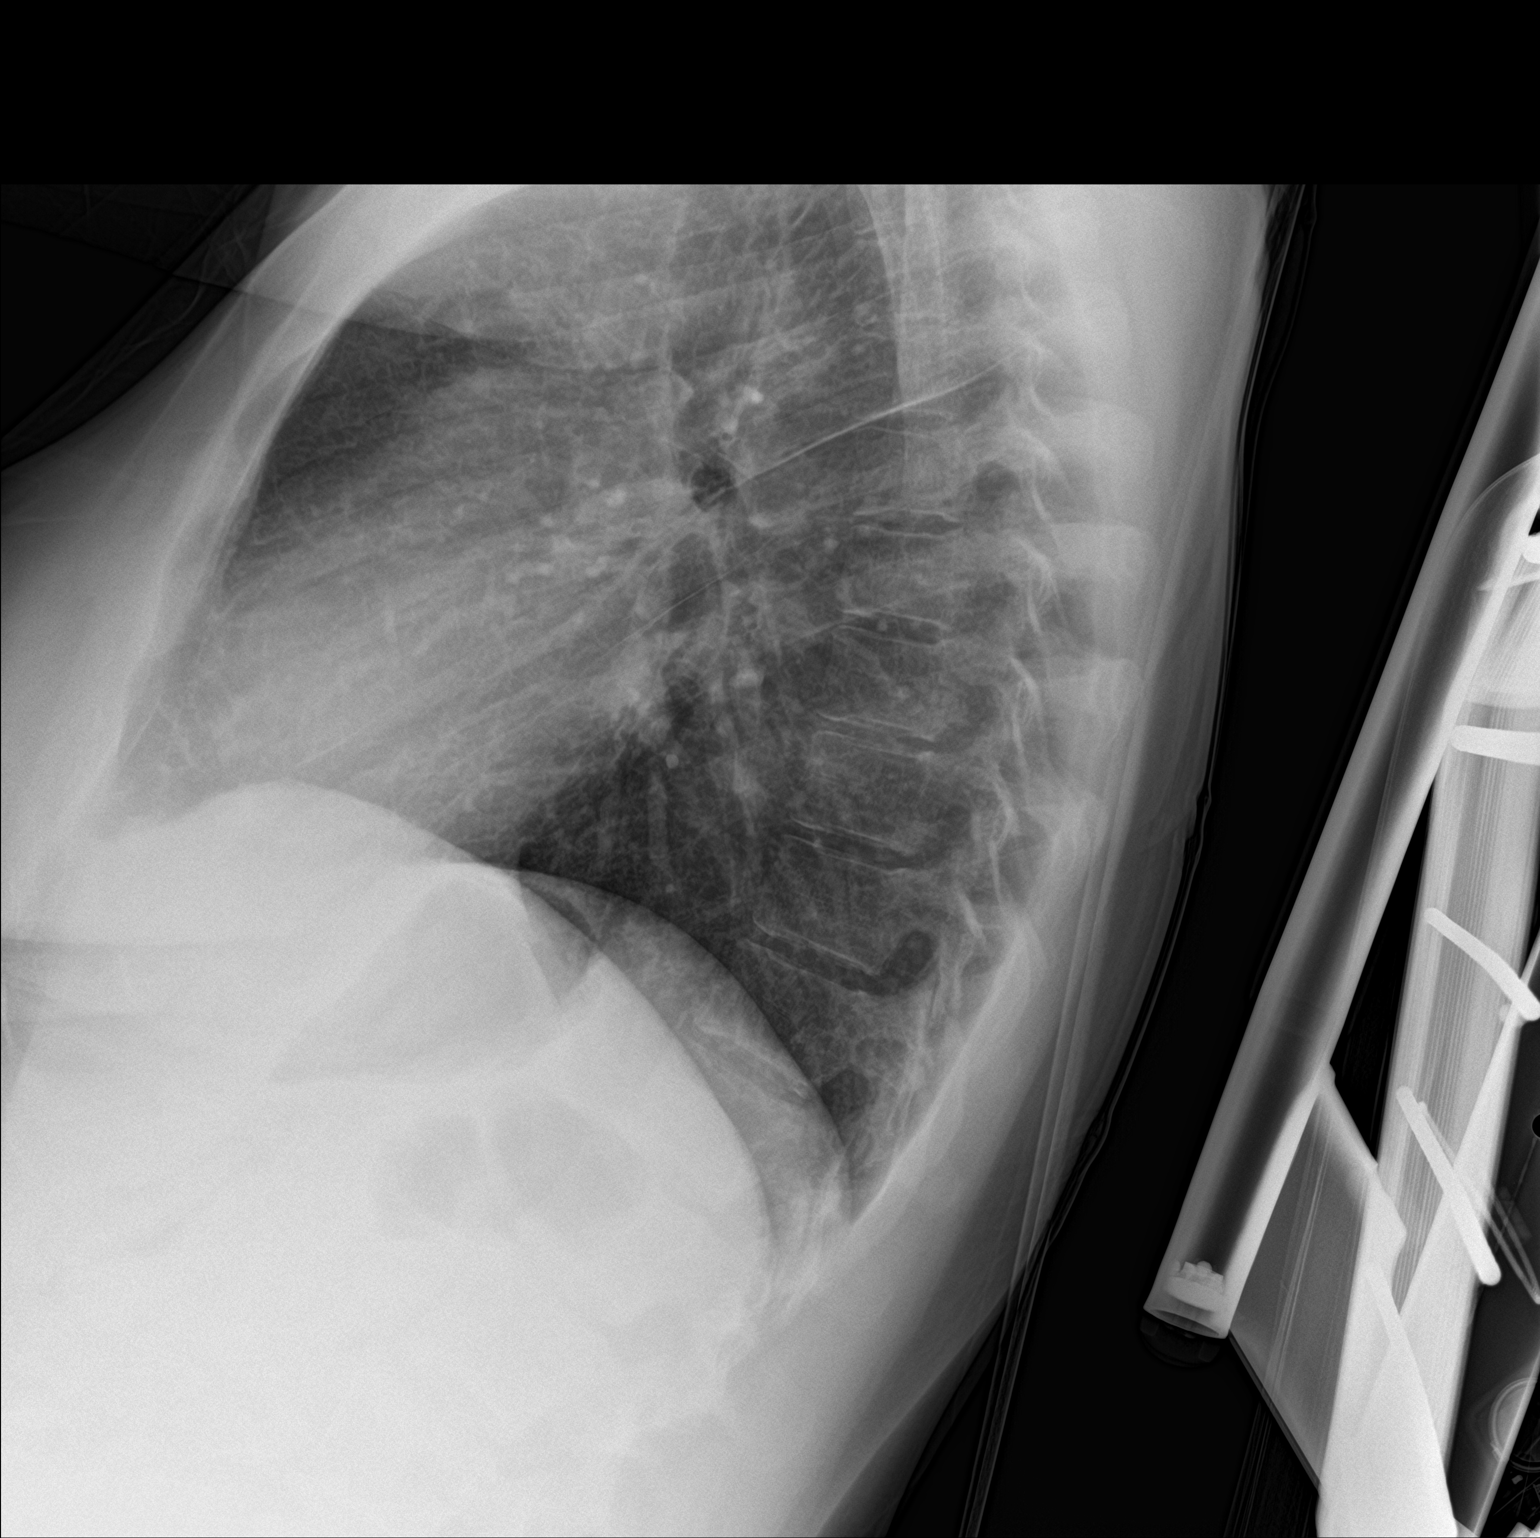

[chest ap]
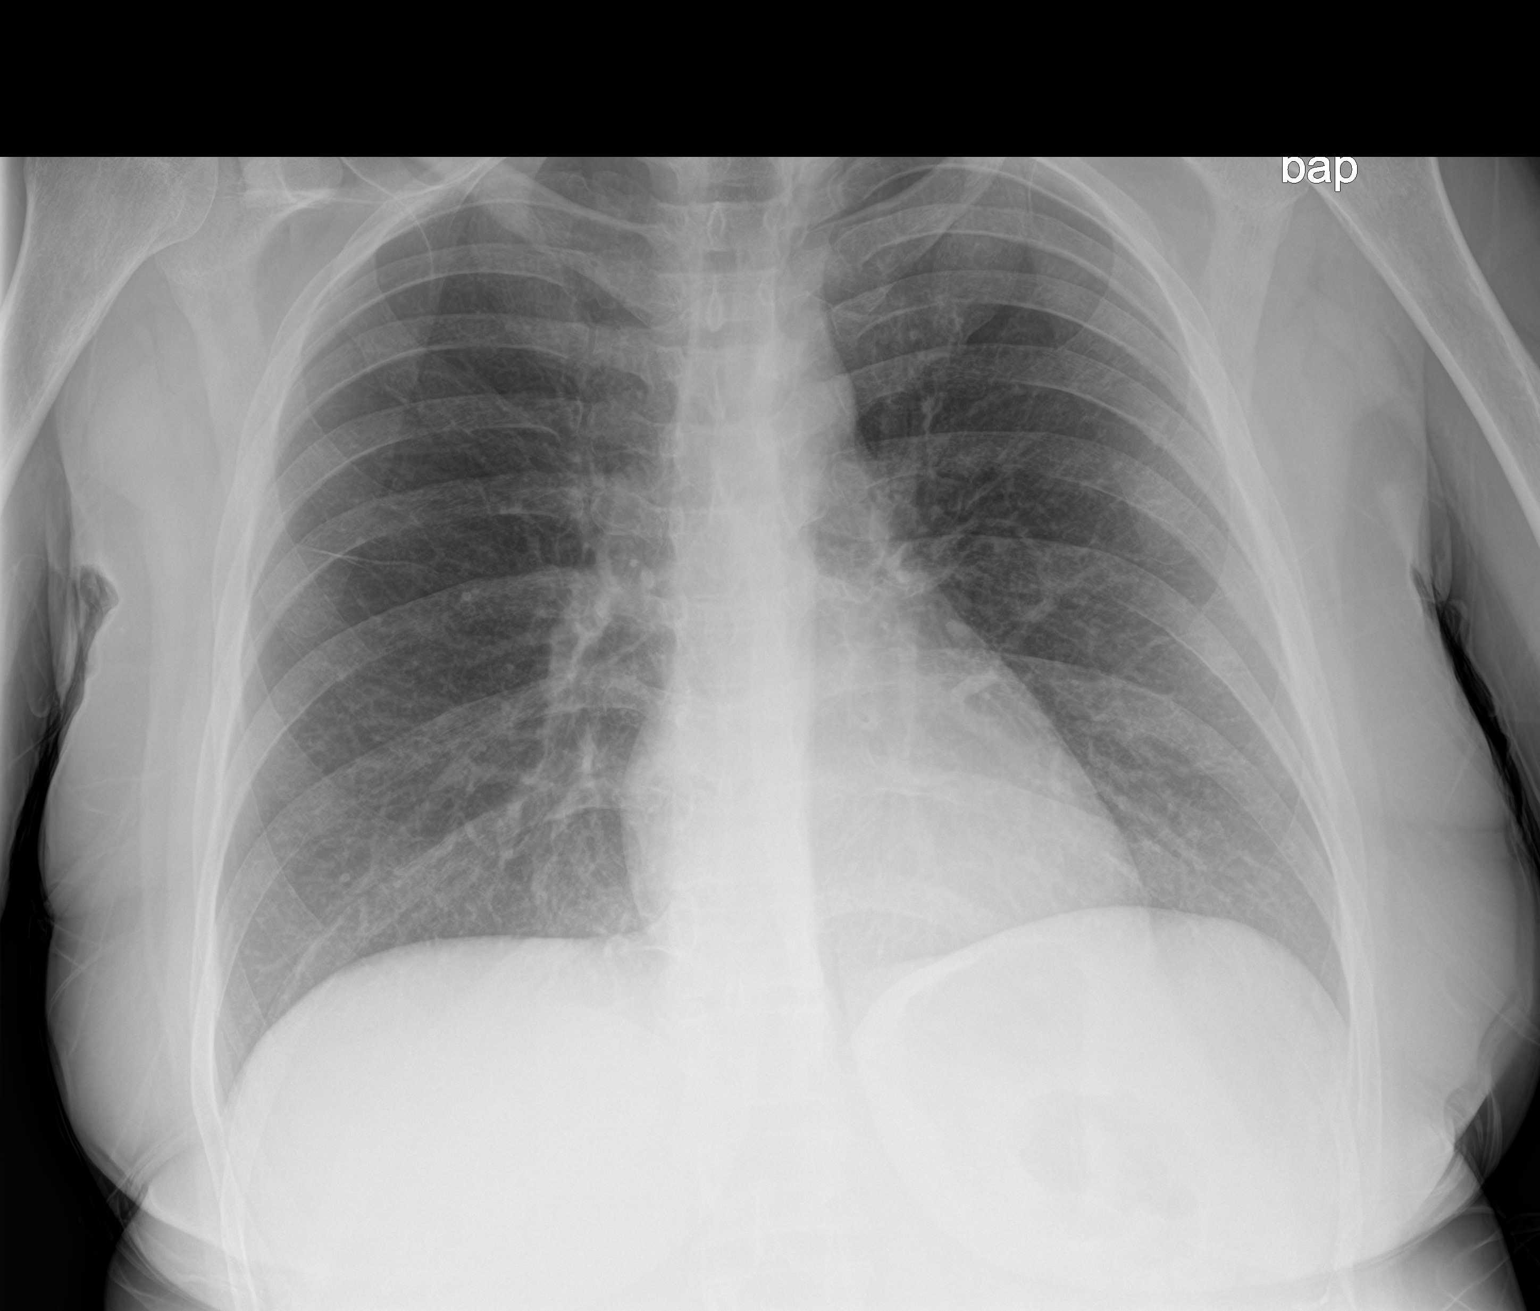

[2 of 2 positions shown; findings below may reference images not displayed]

FINDINGS: The heart size and mediastinal contours are within normal limits.
Both lungs are clear. The visualized skeletal structures are
unremarkable.
IMPRESSION: No active cardiopulmonary disease.

## 2021-07-08 IMAGING — US US ABDOMEN LIMITED RUQ/ASCITES
1 series · 14 of 25 positions shown · non-contrast
Comparison: None.

CLINICAL DATA: Right upper quadrant pain.

EXAM:
ULTRASOUND ABDOMEN LIMITED RIGHT UPPER QUADRANT

[Series 1: us abdomen limited · 14 of 78 slices shown]
[im 1/78]
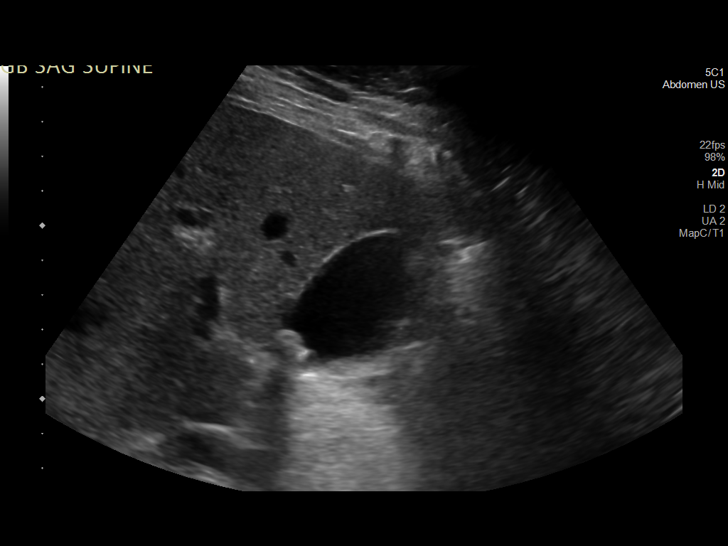
[im 7/78]
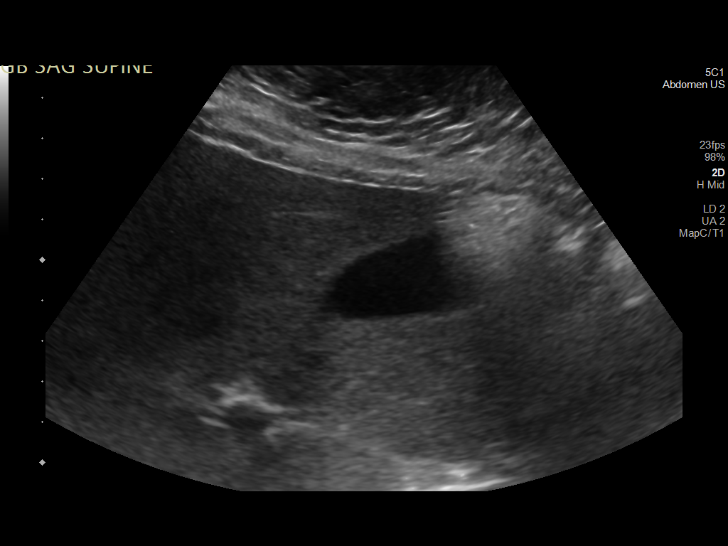
[im 13/78]
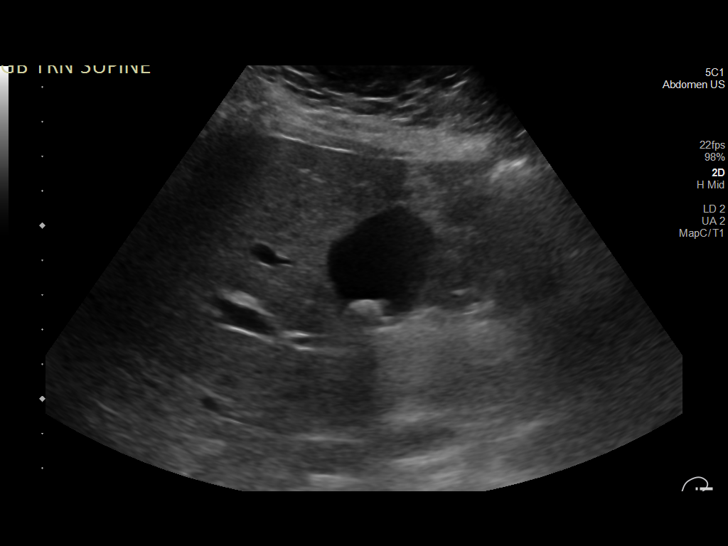
[im 20/78]
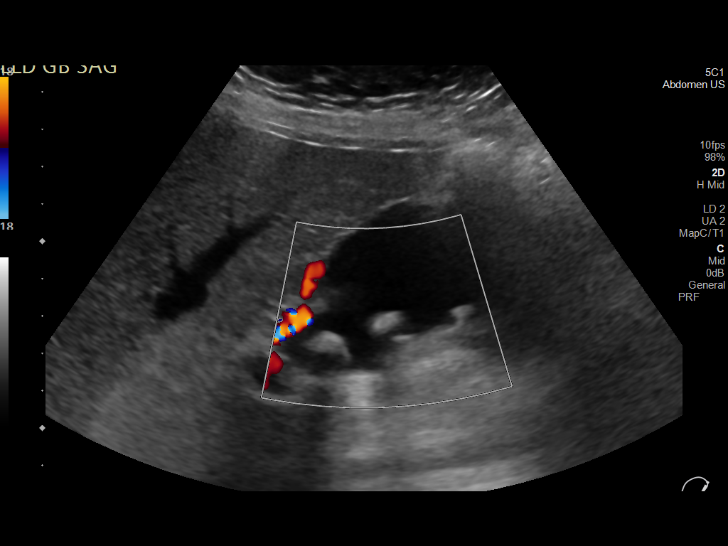
[im 26/78]
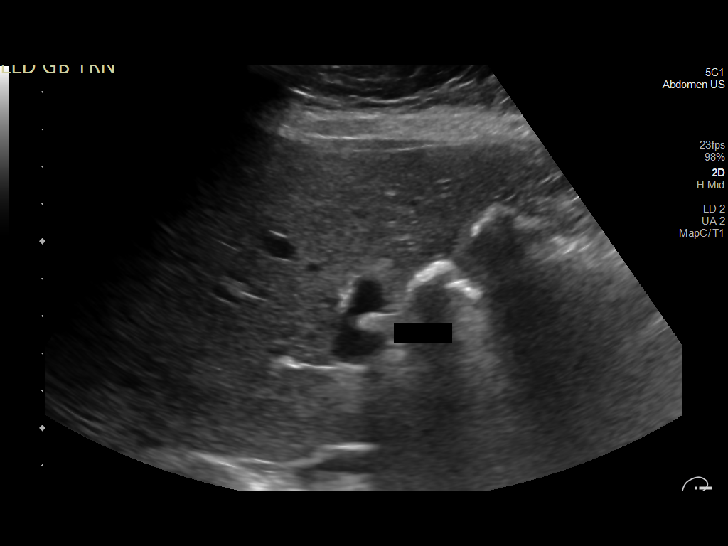
[im 29/78]
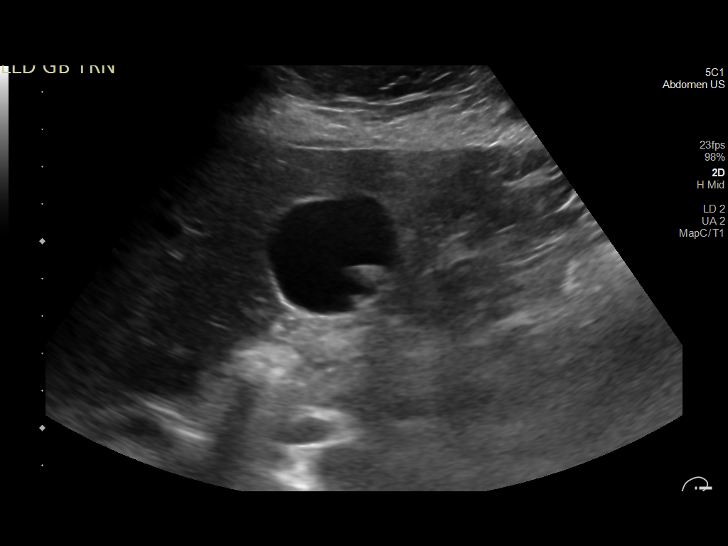
[im 36/78]
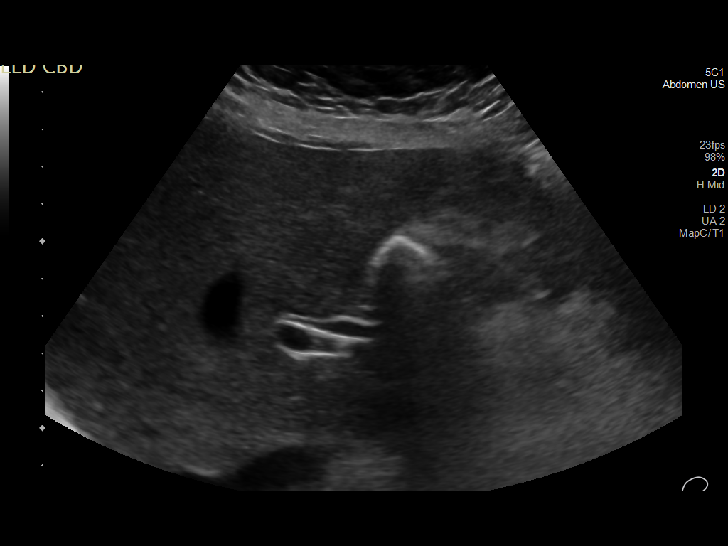
[im 42/78]
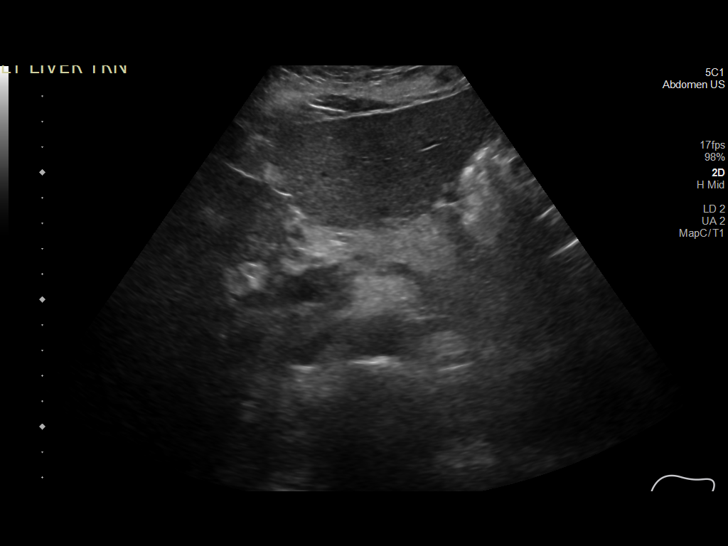
[im 49/78]
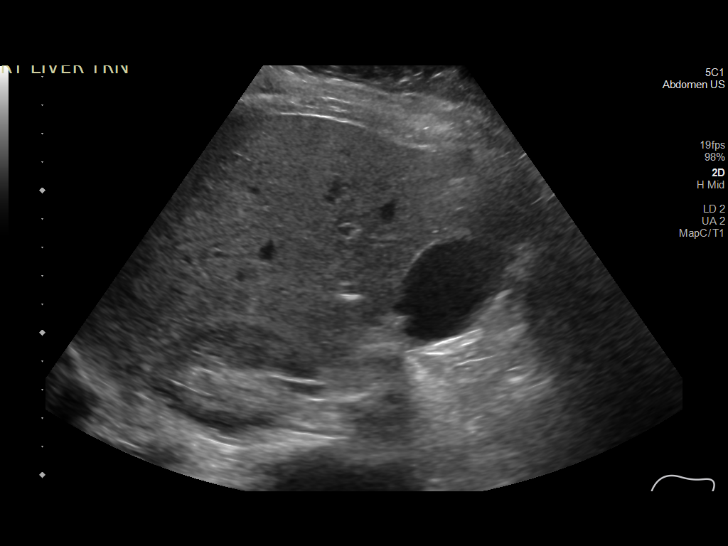
[im 52/78]
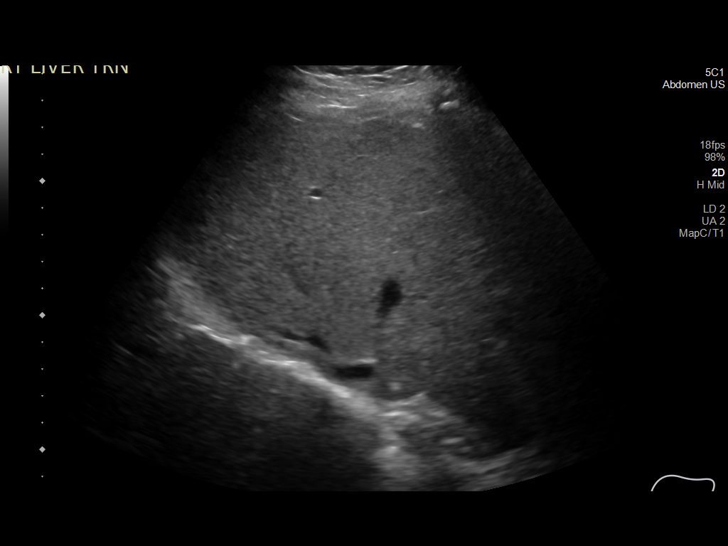
[im 58/78]
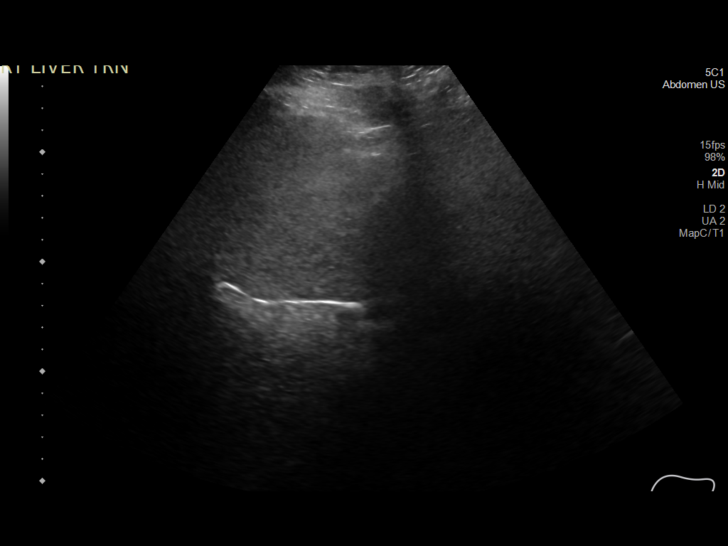
[im 65/78]
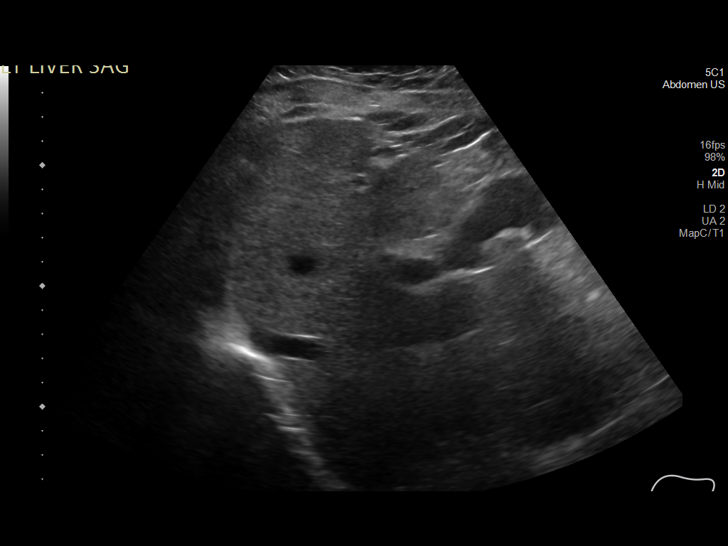
[im 71/78]
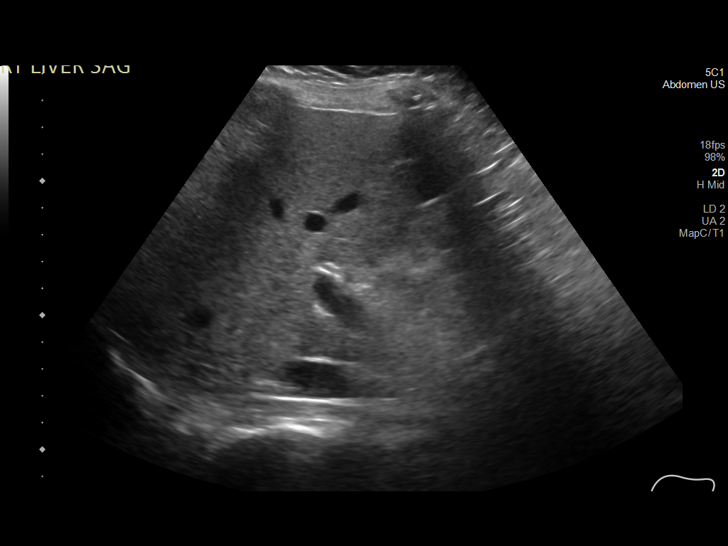
[im 78/78]
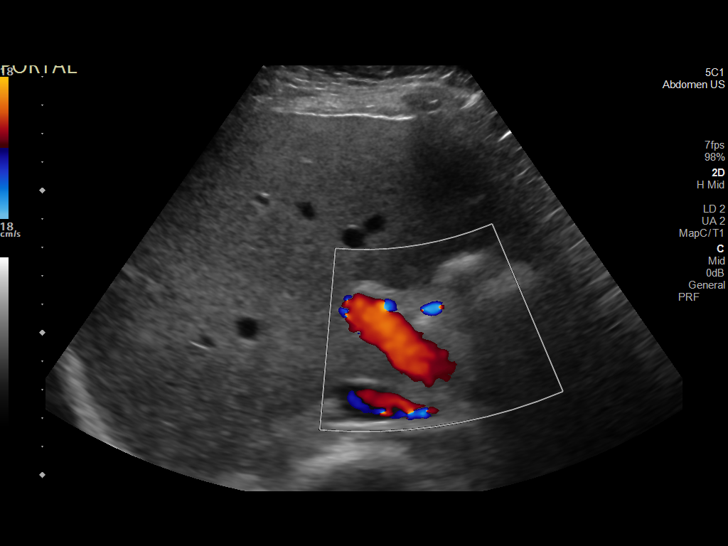

[14 of 25 positions shown; findings below may reference images not displayed]

FINDINGS: Gallbladder:

Cholelithiasis with mobile stones in the gallbladder. No gallbladder
wall thickening or edema. Murphy's sign is positive.

Common bile duct:

Diameter: 4 mm, normal

Liver:

No focal lesion identified. Within normal limits in parenchymal
echogenicity. Portal vein is patent on color Doppler imaging with
normal direction of blood flow towards the liver.

Other: None.
IMPRESSION: Cholelithiasis with positive Murphy's sign is suggestive of acute
cholecystitis in the appropriate clinical setting.
